# Patient Record
Sex: Male | Born: 1995
Health system: Southern US, Community
[De-identification: ages and names within clinical notes are randomized; demographics above are authoritative.]

## PROBLEM LIST (undated history)

## (undated) DIAGNOSIS — K4090 Unilateral inguinal hernia, without obstruction or gangrene, not specified as recurrent: Secondary | ICD-10-CM

## (undated) DIAGNOSIS — I10 Essential (primary) hypertension: Secondary | ICD-10-CM

## (undated) DIAGNOSIS — T7840XA Allergy, unspecified, initial encounter: Secondary | ICD-10-CM

## (undated) DIAGNOSIS — F909 Attention-deficit hyperactivity disorder, unspecified type: Secondary | ICD-10-CM

## (undated) HISTORY — DX: Attention-deficit hyperactivity disorder, unspecified type: F90.9

## (undated) HISTORY — PX: ADENOIDECTOMY: SUR15

## (undated) HISTORY — PX: BUNIONECTOMY: SHX129

## (undated) HISTORY — DX: Essential (primary) hypertension: I10

## (undated) HISTORY — PX: TONSILLECTOMY: SUR1361

## (undated) HISTORY — DX: Allergy, unspecified, initial encounter: T78.40XA

---
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
## 2013-06-11 ENCOUNTER — Encounter (HOSPITAL_BASED_OUTPATIENT_CLINIC_OR_DEPARTMENT_OTHER): Payer: Self-pay

## 2013-06-11 ENCOUNTER — Ambulatory Visit (HOSPITAL_BASED_OUTPATIENT_CLINIC_OR_DEPARTMENT_OTHER): Admit: 2013-06-11 | Payer: 59 | Admitting: Otolaryngology

## 2013-06-11 SURGERY — SEPTOPLASTY, NOSE, WITH NASAL TURBINATE REDUCTION
Anesthesia: General

## 2013-07-09 ENCOUNTER — Other Ambulatory Visit (HOSPITAL_COMMUNITY): Payer: Self-pay | Admitting: Orthopedic Surgery

## 2013-07-13 ENCOUNTER — Other Ambulatory Visit (HOSPITAL_COMMUNITY): Payer: 59

## 2013-08-13 ENCOUNTER — Ambulatory Visit (INDEPENDENT_AMBULATORY_CARE_PROVIDER_SITE_OTHER): Payer: 59

## 2013-08-13 DIAGNOSIS — Z23 Encounter for immunization: Secondary | ICD-10-CM

## 2013-08-20 ENCOUNTER — Ambulatory Visit (INDEPENDENT_AMBULATORY_CARE_PROVIDER_SITE_OTHER): Payer: 59

## 2013-08-20 VITALS — BP 145/76 | HR 63 | Resp 16 | Ht 77.0 in | Wt 220.0 lb

## 2013-08-20 DIAGNOSIS — M2012 Hallux valgus (acquired), left foot: Secondary | ICD-10-CM

## 2013-08-20 DIAGNOSIS — Z9889 Other specified postprocedural states: Secondary | ICD-10-CM

## 2013-08-20 DIAGNOSIS — M2010 Hallux valgus (acquired), unspecified foot: Secondary | ICD-10-CM

## 2013-08-20 NOTE — Addendum Note (Signed)
Addended by: Alphia Kava D on: 08/20/2013 02:45 PM   Modules accepted: Orders

## 2013-08-20 NOTE — Patient Instructions (Signed)
Scar Minimization You will have a scar anytime you have surgery and a cut is made in the skin or you have something removed from your skin (mole, skin cancer, cyst). Although scars are unavoidable following surgery, there are ways to minimize their appearance. It is important to follow all the instructions you receive from your caregiver about wound care. How your wound heals will influence the appearance of your scar. If you do not follow the wound care instructions as directed, complications such as infection may occur. Wound instructions include keeping the wound clean, moist, and not letting the wound form a scab. Some people form scars that are raised and lumpy (hypertrophic) or larger than the initial wound (keloidal). HOME CARE INSTRUCTIONS   Follow wound care instructions as directed.  Keep the wound clean by washing it with soap and water.  Keep the wound moist with provided antibiotic cream or petroleum jelly until completely healed. Moisten twice a day for about 2 weeks.  Get stitches (sutures) taken out at the scheduled time.  Avoid touching or manipulating your wound unless needed. Wash your hands thoroughly before and after touching your wound.  Follow all restrictions such as limits on exercise or work. This depends on where your scar is located.  Keep the scar protected from sunburn. Cover the scar with sunscreen/sunblock with SPF 30 or higher.  Gently massage the scar using a circular motion to help minimize the appearance of the scar. Do this only after the wound has closed and all the sutures have been removed.  For hypertrophic or keloidal scars, there are several ways to treat and minimize their appearance. Methods include compression therapy, intralesional corticosteroids, laser therapy, or surgery. These methods are performed by your caregiver. Remember that the scar may appear lighter or darker than your normal skin color. This difference in color should even out with  time. SEEK MEDICAL CARE IF:   You have a fever.  You develop signs of infection such as pain, redness, pus, and warmth.  You have questions or concerns. Document Released: 04/10/2010 Document Revised: 01/13/2012 Document Reviewed: 04/10/2010 [facility] Patient Information 2014 [place], [place]. Conditions for long-term topical applications are cocoa butter apply daily to the incision site. Medrma is a topical over-the-counter preparation packet help reduce scar thickening and discoloration

## 2013-08-20 NOTE — Progress Notes (Signed)
  Subjective:    Patient ID: Gary Leon, male    DOB: November 11, 1995, 17 y.o.   MRN: [id number] "It's good."  HPI patient is nearly 5 months postop [place] bunionectomy with screw fixation left foot. No complaints of pain or discomfort. Clinically incision clean dry well coapted slight hypertrophy and discoloration of the scars noted. Range of motion great toe is excellent proximally 80 dorsiflexion 10-15 plantar flex.    Review of Systems not reviewed     Objective:   Physical Exam And VSI. Pedal pulses palpable. No other new findings. X-rays reviewed reveal rectus hallux sesamoid position 1 intact screw fixation first metatarsal with good clinical alignment of the osteotomy and hallux. Clinically and radiographically mild posterior changes noted on the foot. The recommendation for good supportive shoes, or additional orthotics or arch supports to the shoes and cleats. Patient has returned all his regular athletic activities. However his injured his right arm and is likely to have arm surgery sometime in the near future    Assessment & Plan:  Good postop progress with clinical and radiographic left foot status post bunion correction. Maintain good supportive shoes at all times. Continued active passive range of motion exercises. Also suggested he use of cocoa butter or middermal to help with scar. Patient discharged from our care at this time  Alvan Dame DPM

## 2014-08-08 ENCOUNTER — Encounter: Payer: Self-pay | Admitting: Family Medicine

## 2014-08-08 ENCOUNTER — Ambulatory Visit (INDEPENDENT_AMBULATORY_CARE_PROVIDER_SITE_OTHER): Payer: 59 | Admitting: Family Medicine

## 2014-08-08 VITALS — BP 127/74 | HR 81 | Temp 98.0°F | Ht 78.0 in | Wt 220.0 lb

## 2014-08-08 DIAGNOSIS — J0100 Acute maxillary sinusitis, unspecified: Secondary | ICD-10-CM

## 2014-08-08 MED ORDER — AZITHROMYCIN 250 MG PO TABS: ORAL_TABLET | ORAL | Status: DC

## 2014-08-08 NOTE — Progress Notes (Signed)
Pre visit review using our clinic review tool, if applicable. No additional management support is needed unless otherwise documented below in the visit note. 

## 2014-08-08 NOTE — Progress Notes (Signed)
   Subjective:    Patient ID: Gary SlickerBrant R Leon, male    DOB: 1996/04/28, 18 y.o.   MRN: [id number]  HPI Here for 10 days of sinus pressure , PND, and coughing up green sputum. No fever. On Dayquil. He is currently playing baseball for [employer] and he hopes to transfer to either [employer] or [employer] next January. He is off all ADHD meds.    Review of Systems  Constitutional: Negative.   HENT: Positive for congestion, postnasal drip and sinus pressure.   Eyes: Negative.   Respiratory: Positive for cough.        Objective:   Physical Exam  Constitutional: He appears well-developed and well-nourished.  HENT:  Right Ear: External ear normal.  Left Ear: External ear normal.  Nose: Nose normal.  Mouth/Throat: Oropharynx is clear and moist.  Eyes: Conjunctivae are normal.  Pulmonary/Chest: Effort normal and breath sounds normal.  Lymphadenopathy:    He has no cervical adenopathy.          Assessment & Plan:  Add Mucinex

## 2015-03-16 DIAGNOSIS — Z792 Long term (current) use of antibiotics: Secondary | ICD-10-CM | POA: Diagnosis not present

## 2015-03-16 DIAGNOSIS — K4090 Unilateral inguinal hernia, without obstruction or gangrene, not specified as recurrent: Secondary | ICD-10-CM | POA: Insufficient documentation

## 2015-03-16 DIAGNOSIS — I10 Essential (primary) hypertension: Secondary | ICD-10-CM | POA: Diagnosis not present

## 2015-03-16 DIAGNOSIS — Z720 Tobacco use: Secondary | ICD-10-CM | POA: Diagnosis not present

## 2015-03-16 DIAGNOSIS — R1031 Right lower quadrant pain: Secondary | ICD-10-CM | POA: Diagnosis present

## 2015-03-16 NOTE — ED Notes (Signed)
Pt in with co rlq pain since today, has hx of inguinal hernia supposed to have surgical repair.

## 2015-03-17 ENCOUNTER — Emergency Department: Payer: 59

## 2015-03-17 ENCOUNTER — Emergency Department
Admission: EM | Admit: 2015-03-17 | Discharge: 2015-03-17 | Disposition: A | Discharge status: Home / Self Care | Payer: 59 | Attending: Emergency Medicine | Admitting: Emergency Medicine

## 2015-03-17 ENCOUNTER — Encounter: Payer: Self-pay | Admitting: Emergency Medicine

## 2015-03-17 ENCOUNTER — Encounter: Payer: Self-pay | Admitting: Family Medicine

## 2015-03-17 DIAGNOSIS — K4090 Unilateral inguinal hernia, without obstruction or gangrene, not specified as recurrent: Secondary | ICD-10-CM

## 2015-03-17 DIAGNOSIS — R109 Unspecified abdominal pain: Secondary | ICD-10-CM

## 2015-03-17 HISTORY — DX: Unilateral inguinal hernia, without obstruction or gangrene, not specified as recurrent: K40.90

## 2015-03-17 LAB — CBC WITH DIFFERENTIAL/PLATELET
BASOS ABS: 0.1 10*3/uL (ref 0–0.1)
Basophils Relative: 1 %
EOS ABS: 0.2 10*3/uL (ref 0–0.7)
Eosinophils Relative: 2 %
HCT: 41.7 % (ref 40.0–52.0)
Hemoglobin: 14 g/dL (ref 13.0–18.0)
LYMPHS ABS: 4.2 10*3/uL — AB (ref 1.0–3.6)
LYMPHS PCT: 50 %
MCH: 29.5 pg (ref 26.0–34.0)
MCHC: 33.6 g/dL (ref 32.0–36.0)
MCV: 87.7 fL (ref 80.0–100.0)
Monocytes Absolute: 0.7 10*3/uL (ref 0.2–1.0)
Monocytes Relative: 8 %
NEUTROS PCT: 39 %
Neutro Abs: 3.3 10*3/uL (ref 1.4–6.5)
PLATELETS: 223 10*3/uL (ref 150–440)
RBC: 4.75 MIL/uL (ref 4.40–5.90)
RDW: 13 % (ref 11.5–14.5)
WBC: 8.3 10*3/uL (ref 3.8–10.6)

## 2015-03-17 LAB — URINALYSIS COMPLETE WITH MICROSCOPIC (ARMC ONLY)
Bacteria, UA: NONE SEEN
Bilirubin Urine: NEGATIVE
GLUCOSE, UA: NEGATIVE mg/dL
HGB URINE DIPSTICK: NEGATIVE
KETONES UR: NEGATIVE mg/dL
Leukocytes, UA: NEGATIVE
Nitrite: NEGATIVE
Protein, ur: NEGATIVE mg/dL
Specific Gravity, Urine: 1.02 (ref 1.005–1.030)
WBC, UA: NONE SEEN WBC/hpf (ref 0–5)
pH: 7 (ref 5.0–8.0)

## 2015-03-17 LAB — BASIC METABOLIC PANEL
Anion gap: 8 (ref 5–15)
BUN: 15 mg/dL (ref 6–20)
CO2: 25 mmol/L (ref 22–32)
CREATININE: 1.04 mg/dL (ref 0.61–1.24)
Calcium: 9.3 mg/dL (ref 8.9–10.3)
Chloride: 106 mmol/L (ref 101–111)
GFR calc Af Amer: >60 mL/min (ref >60)
GFR calc non Af Amer: >60 mL/min (ref >60)
Glucose, Bld: 102 mg/dL — ABNORMAL HIGH (ref 65–99)
POTASSIUM: 3.6 mmol/L (ref 3.5–5.1)
SODIUM: 139 mmol/L (ref 135–145)

## 2015-03-17 LAB — LACTIC ACID, PLASMA: Lactic Acid, Venous: 1 mmol/L (ref 0.5–2.0)

## 2015-03-17 MED ORDER — ONDANSETRON HCL 4 MG/2ML IJ SOLN
INTRAMUSCULAR | Status: AC
  Administered 2015-03-17: 4 mg via INTRAVENOUS
  Filled 2015-03-17: qty 2

## 2015-03-17 MED ORDER — IOHEXOL 350 MG/ML SOLN
100.0000 mL | Freq: Once | INTRAVENOUS | Status: AC | PRN
  Administered 2015-03-17: 100 mL via INTRAVENOUS

## 2015-03-17 MED ORDER — MORPHINE SULFATE 4 MG/ML IJ SOLN
INTRAMUSCULAR | Status: AC
  Filled 2015-03-17: qty 1

## 2015-03-17 MED ORDER — ONDANSETRON 4 MG PO TBDP
ORAL_TABLET | ORAL | Status: AC
  Administered 2015-03-17: 4 mg via ORAL
  Filled 2015-03-17: qty 1

## 2015-03-17 MED ORDER — OXYCODONE-ACETAMINOPHEN 5-325 MG PO TABS: 1.0000 | ORAL_TABLET | ORAL | Status: DC | PRN

## 2015-03-17 MED ORDER — OXYCODONE-ACETAMINOPHEN 5-325 MG PO TABS
1.0000 | ORAL_TABLET | Freq: Once | ORAL | Status: AC
Start: 2015-03-17 — End: 2015-03-17
  Administered 2015-03-17: 1 via ORAL

## 2015-03-17 MED ORDER — ONDANSETRON HCL 4 MG PO TABS: 4.0000 mg | ORAL_TABLET | Freq: Three times a day (TID) | ORAL | Status: DC | PRN

## 2015-03-17 MED ORDER — ONDANSETRON HCL 4 MG/2ML IJ SOLN
4.0000 mg | Freq: Once | INTRAMUSCULAR | Status: AC
  Administered 2015-03-17: 4 mg via INTRAVENOUS

## 2015-03-17 MED ORDER — IOHEXOL 240 MG/ML SOLN
25.0000 mL | Freq: Once | INTRAMUSCULAR | Status: AC | PRN
  Administered 2015-03-17: 25 mL via ORAL

## 2015-03-17 MED ORDER — OXYCODONE-ACETAMINOPHEN 5-325 MG PO TABS
ORAL_TABLET | ORAL | Status: AC
  Administered 2015-03-17: 1 via ORAL
  Filled 2015-03-17: qty 1

## 2015-03-17 MED ORDER — MORPHINE SULFATE 4 MG/ML IJ SOLN: 4.0000 mg | Freq: Once | INTRAMUSCULAR | Status: DC

## 2015-03-17 MED ORDER — ONDANSETRON 4 MG PO TBDP
4.0000 mg | ORAL_TABLET | Freq: Once | ORAL | Status: AC
  Administered 2015-03-17: 4 mg via ORAL

## 2015-03-17 NOTE — Discharge Instructions (Signed)
1. Take medicines as needed for pain and nausea (Percocet/Zofran #20). 2. Elevate affected area whenever possible. 3. Return to the ER for worsening symptoms, persistent vomiting, fever or other concerns.  Abdominal Pain Many things can cause abdominal pain. Usually, abdominal pain is not caused by a disease and will improve without treatment. It can often be observed and treated at home. Your health care provider will do a physical exam and possibly order blood tests and X-rays to help determine the seriousness of your pain. However, in many cases, more time must pass before a clear cause of the pain can be found. Before that point, your health care provider may not know if you need more testing or further treatment. HOME CARE INSTRUCTIONS  Monitor your abdominal pain for any changes. The following actions may help to alleviate any discomfort you are experiencing:  Only take over-the-counter or prescription medicines as directed by your health care provider.  Do not take laxatives unless directed to do so by your health care provider.  Try a clear liquid diet (broth, tea, or water) as directed by your health care provider. Slowly move to a bland diet as tolerated. SEEK MEDICAL CARE IF:  You have unexplained abdominal pain.  You have abdominal pain associated with nausea or diarrhea.  You have pain when you urinate or have a bowel movement.  You experience abdominal pain that wakes you in the night.  You have abdominal pain that is worsened or improved by eating food.  You have abdominal pain that is worsened with eating fatty foods.  You have a fever. SEEK IMMEDIATE MEDICAL CARE IF:   Your pain does not go away within 2 hours.  You keep throwing up (vomiting).  Your pain is felt only in portions of the abdomen, such as the right side or the left lower portion of the abdomen.  You pass bloody or black tarry stools. MAKE SURE YOU:  Understand these instructions.   Will watch  your condition.   Will get help right away if you are not doing well or get worse.  Document Released: 07/31/2005 Document Revised: 10/26/2013 Document Reviewed: 06/30/2013 [facility] Patient Information 2015 [place], [place]. This information is not intended to replace advice given to you by your health care provider. Make sure you discuss any questions you have with your health care provider.  Inguinal Hernia, Adult Muscles help keep everything in the body in its proper place. But if a weak spot in the muscles develops, something can poke through. That is called a hernia. When this happens in the lower part of the belly (abdomen), it is called an inguinal hernia. (It takes its name from a part of the body in this region called the inguinal canal.) A weak spot in the wall of muscles lets some fat or part of the small intestine bulge through. An inguinal hernia can develop at any age. Men get them more often than women. CAUSES  In adults, an inguinal hernia develops over time.  It can be triggered by:  Suddenly straining the muscles of the lower abdomen.  Lifting heavy objects.  Straining to have a bowel movement. Difficult bowel movements (constipation) can lead to this.  Constant coughing. This may be caused by smoking or lung disease.  Being overweight.  Being pregnant.  Working at a job that requires long periods of standing or heavy lifting.  Having had an inguinal hernia before. One type can be an emergency situation. It is called a strangulated inguinal hernia.  It develops if part of the small intestine slips through the weak spot and cannot get back into the abdomen. The blood supply can be cut off. If that happens, part of the intestine may die. This situation requires emergency surgery. SYMPTOMS  Often, a small inguinal hernia has no symptoms. It is found when a healthcare provider does a physical exam. Larger hernias usually have symptoms.   In adults, symptoms may  include:  A lump in the groin. This is easier to see when the person is standing. It might disappear when lying down.  In men, a lump in the scrotum.  Pain or burning in the groin. This occurs especially when lifting, straining or coughing.  A dull ache or feeling of pressure in the groin.  Signs of a strangulated hernia can include:  A bulge in the groin that becomes very painful and tender to the touch.  A bulge that turns red or purple.  Fever, nausea and vomiting.  Inability to have a bowel movement or to pass gas. DIAGNOSIS  To decide if you have an inguinal hernia, a healthcare provider will probably do a physical examination.  This will include asking questions about any symptoms you have noticed.  The healthcare provider might feel the groin area and ask you to cough. If an inguinal hernia is felt, the healthcare provider may try to slide it back into the abdomen.  Usually no other tests are needed. TREATMENT  Treatments can vary. The size of the hernia makes a difference. Options include:  Watchful waiting. This is often suggested if the hernia is small and you have had no symptoms.  No medical procedure will be done unless symptoms develop.  You will need to watch closely for symptoms. If any occur, contact your healthcare provider right away.  Surgery. This is used if the hernia is larger or you have symptoms.  Open surgery. This is usually an outpatient procedure (you will not stay overnight in a hospital). An cut (incision) is made through the skin in the groin. The hernia is put back inside the abdomen. The weak area in the muscles is then repaired by herniorrhaphy or hernioplasty. Herniorrhaphy: in this type of surgery, the weak muscles are sewn back together. Hernioplasty: a patch or mesh is used to close the weak area in the abdominal wall.  Laparoscopy. In this procedure, a surgeon makes small incisions. A thin tube with a tiny video camera (called a  laparoscope) is put into the abdomen. The surgeon repairs the hernia with mesh by looking with the video camera and using two long instruments. HOME CARE INSTRUCTIONS   After surgery to repair an inguinal hernia:  You will need to take pain medicine prescribed by your healthcare provider. Follow all directions carefully.  You will need to take care of the wound from the incision.  Your activity will be restricted for awhile. This will probably include no heavy lifting for several weeks. You also should not do anything too active for a few weeks. When you can return to work will depend on the type of job that you have.  During "watchful waiting" periods, you should:  Maintain a healthy weight.  Eat a diet high in fiber (fruits, vegetables and whole grains).  Drink plenty of fluids to avoid constipation. This means drinking enough water and other liquids to keep your urine clear or pale yellow.  Do not lift heavy objects.  Do not stand for long periods of time.  Quit smoking.  This should keep you from developing a frequent cough. SEEK MEDICAL CARE IF:   A bulge develops in your groin area.  You feel pain, a burning sensation or pressure in the groin. This might be worse if you are lifting or straining.  You develop a fever of more than 100.5 F (38.1 C). SEEK IMMEDIATE MEDICAL CARE IF:   Pain in the groin increases suddenly.  A bulge in the groin gets bigger suddenly and does not go down.  For men, there is sudden pain in the scrotum. Or, the size of the scrotum increases.  A bulge in the groin area becomes red or purple and is painful to touch.  You have nausea or vomiting that does not go away.  You feel your heart beating much faster than normal.  You cannot have a bowel movement or pass gas.  You develop a fever of more than 102.0 F (38.9 C). Document Released: 03/09/2009 Document Revised: 01/13/2012 Document Reviewed: 03/09/2009 [facility] Patient Information  2015 [employer] [place]. This information is not intended to replace advice given to you by your health care provider. Make sure you discuss any questions you have with your health care provider.

## 2015-03-17 NOTE — ED Provider Notes (Signed)
[facility] Emergency Department Provider Note  ____________________________________________  Time seen: Approximately 2:29 AM  I have reviewed the triage vital signs and the nursing notes.   HISTORY  Chief Complaint Abdominal Pain    HPI Gary Leon is a 19 y.o. male who presents with right lower quadrant pain today. Patient was diagnosed with a sports-related right inguinal herniaapproximately 2 weeks ago and set up with a local surgeon at the end of this month. Patient is in town playing baseball and presents with parents complaining of increased pain today especially on standing and walking stairs. Describes 8/10 sharp, stabbing, nonradiating pain to right lower quadrant associated with nausea. Denies fever, chills, vomiting, diarrhea, dysuria.   Past Medical History  Diagnosis Date  . Allergy   . ADHD (attention deficit hyperactivity disorder)   . Hypertension   . Inguinal hernia     There are no active problems to display for this patient.   Past Surgical History  Procedure Laterality Date  . Adenoidectomy    . Tonsillectomy    . Bunionectomy Left     Current Outpatient Rx  Name  Route  Sig  Dispense  Refill  . azithromycin (ZITHROMAX) 250 MG tablet      As directed Patient not taking: Reported on 03/17/2015   6 tablet   0   . ondansetron (ZOFRAN) 4 MG tablet   Oral   Take 1 tablet (4 mg total) by mouth every 8 (eight) hours as needed for nausea or vomiting.   20 tablet   1   . oxyCODONE-acetaminophen (PERCOCET/ROXICET) 5-325 MG per tablet   Oral   Take 1 tablet by mouth every 4 (four) hours as needed for severe pain.   20 tablet   0     Allergies Review of patient's allergies indicates no known allergies.  History reviewed. No pertinent family history.  Social History History  Substance Use Topics  . Smoking status: Current Some Day Smoker  . Smokeless tobacco: Never Used  . Alcohol Use: Yes     Comment: once a  week    Review of Systems Constitutional: No fever/chills Eyes: No visual changes. ENT: No sore throat. Cardiovascular: Denies chest pain. Respiratory: Denies shortness of breath. Gastrointestinal: Positive for abdominal pain.  Positive for nausea. No vomiting.  No diarrhea.  No constipation. Genitourinary: Negative for dysuria. Musculoskeletal: Negative for back pain. Skin: Negative for rash. Neurological: Negative for headaches, focal weakness or numbness.  10-point ROS otherwise negative.  ____________________________________________   PHYSICAL EXAM:  VITAL SIGNS: ED Triage Vitals  Enc Vitals Group     BP 03/16/15 2227 147/82 mmHg     Pulse Rate 03/16/15 2227 90     Resp 03/16/15 2227 18     Temp 03/16/15 2227 98.2 F (36.8 C)     Temp Source 03/16/15 2227 Oral     SpO2 03/16/15 2227 100 %     Weight 03/16/15 2227 225 lb (102.059 kg)     Height 03/16/15 2227 6\' 5"  (1.956 m)     Head Cir --      Peak Flow --      Pain Score 03/16/15 2228 4     Pain Loc --      Pain Edu? --      Excl. in GC? --     Constitutional: Alert and oriented. Well appearing and in no acute distress. Eyes: Conjunctivae are normal. PERRL. EOMI. Head: Atraumatic. Nose: No congestion/rhinnorhea. Mouth/Throat: Mucous membranes are  moist.  Oropharynx non-erythematous. Neck: No stridor.   Cardiovascular: Normal rate, regular rhythm. Grossly normal heart sounds.  Good peripheral circulation. Respiratory: Normal respiratory effort.  No retractions. Lungs CTAB. Gastrointestinal: Soft, tender to palpation right lower quadrant without rebound/guarding. No distention. No abdominal bruits. No CVA tenderness. Genitourinary: Small right inguinal hernia noted; appears larger on standing. Too small to appreciate whether hernia is reducible. Musculoskeletal: No lower extremity tenderness nor edema.  No joint effusions. Neurologic:  Normal speech and language. No gross focal neurologic deficits are  appreciated. Speech is normal. No gait instability. Skin:  Skin is warm, dry and intact. No rash noted. Psychiatric: Mood and affect are normal. Speech and behavior are normal.  ____________________________________________   LABS (all labs ordered are listed, but only abnormal results are displayed)  Labs Reviewed  BASIC METABOLIC PANEL - Abnormal; Notable for the following:    Glucose, Bld 102 (*)    All other components within normal limits  CBC WITH DIFFERENTIAL/PLATELET - Abnormal; Notable for the following:    Lymphs Abs 4.2 (*)    All other components within normal limits  URINALYSIS COMPLETEWITH MICROSCOPIC ([facility])  - Abnormal; Notable for the following:    Color, Urine YELLOW (*)    APPearance CLEAR (*)    Squamous Epithelial / LPF 0-5 (*)    All other components within normal limits  LACTIC ACID, PLASMA   ____________________________________________  EKG  None ____________________________________________  RADIOLOGY  CT abdomen and pelvis with contrast interpreted per Dr. Andria MeuseStevens:  Small right inguinal hernia containing fluid. No apparent bowel content. No evidence of bowel obstruction.  ____________________________________________   PROCEDURES  Procedure(s) performed: None  Critical Care performed: No  ____________________________________________   INITIAL IMPRESSION / ASSESSMENT AND PLAN / ED COURSE  Pertinent labs & imaging results that were available during my care of the patient were reviewed by me and considered in my medical decision making (see chart for details).  19 year old male presents with increased pain from small right inguinal hernia. Concern for entrapment. Will check lactate and obtain CT abdomen/pelvis to evaluate incarcerated or strangulated hernia. Patient offered morphine but declined.  ----------------------------------------- 5:23 AM on 03/17/2015 -----------------------------------------  Patient resting in no acute  distress. Discussed with patient and parents at length CT results. Given there is no evidence of incarceration or strangulation, patient will be discharged home with analgesics [facility] or follow-up locally. Advised no sports activities until seen by surgery. Strict return precautions given. All verbalized understanding and agrees with plan. ____________________________________________   FINAL CLINICAL IMPRESSION(S) / ED DIAGNOSES  Final diagnoses:  Abdominal pain in male  Right inguinal hernia      Irean HongJade J Sung, MD 03/17/15 [phone]

## 2015-03-22 NOTE — Telephone Encounter (Signed)
I cannot refer him for a problem that I have not evaluated. Did the ER doctor do a referral ?

## 2015-10-17 ENCOUNTER — Ambulatory Visit: Payer: 59 | Admitting: Family Medicine

## 2015-10-24 ENCOUNTER — Encounter: Payer: Self-pay | Admitting: Family Medicine

## 2015-10-24 ENCOUNTER — Ambulatory Visit (INDEPENDENT_AMBULATORY_CARE_PROVIDER_SITE_OTHER): Payer: 59 | Admitting: Family Medicine

## 2015-10-24 VITALS — BP 120/70 | Temp 97.6°F | Wt 220.8 lb

## 2015-10-24 DIAGNOSIS — J209 Acute bronchitis, unspecified: Secondary | ICD-10-CM | POA: Diagnosis not present

## 2015-10-24 MED ORDER — AZITHROMYCIN 250 MG PO TABS: ORAL_TABLET | ORAL | Status: DC

## 2015-10-24 MED ORDER — HYDROCODONE-HOMATROPINE 5-1.5 MG/5ML PO SYRP: 5.0000 mL | ORAL_SOLUTION | ORAL | Status: DC | PRN

## 2015-10-24 NOTE — Progress Notes (Signed)
   Subjective:    Patient ID: Gary Leon, male    DOB: 1996-05-23, 19 y.o.   MRN: [id number]  HPI Here for 2 weeks of stuffy head, PND, chest congestion and coughing up yellow sputum. No fever.    Review of Systems  Constitutional: Negative.   HENT: Positive for congestion, postnasal drip, sinus pressure and sore throat. Negative for ear pain.   Eyes: Negative.   Respiratory: Positive for chest tightness. Negative for cough.   Cardiovascular: Negative for chest pain.       Objective:   Physical Exam  Constitutional: He appears well-developed and well-nourished.  HENT:  Right Ear: External ear normal.  Left Ear: External ear normal.  Nose: Nose normal.  Mouth/Throat: Oropharynx is clear and moist.  Eyes: Conjunctivae are normal.  Neck: No thyromegaly present.  Pulmonary/Chest: Effort normal. No respiratory distress. He has no wheezes. He has no rales.  Scattered rhonchi   Lymphadenopathy:    He has no cervical adenopathy.          Assessment & Plan:  Bronchitis, treat with a Zpack and Mucinex

## 2015-10-24 NOTE — Progress Notes (Signed)
Pre visit review using our clinic review tool, if applicable. No additional management support is needed unless otherwise documented below in the visit note. 

## 2016-03-15 DIAGNOSIS — Z5189 Encounter for other specified aftercare: Secondary | ICD-10-CM | POA: Diagnosis not present

## 2016-03-15 DIAGNOSIS — M65811 Other synovitis and tenosynovitis, right shoulder: Secondary | ICD-10-CM | POA: Diagnosis not present

## 2016-03-15 DIAGNOSIS — S43421A Sprain of right rotator cuff capsule, initial encounter: Secondary | ICD-10-CM | POA: Diagnosis not present

## 2016-03-15 DIAGNOSIS — I872 Venous insufficiency (chronic) (peripheral): Secondary | ICD-10-CM | POA: Diagnosis not present

## 2016-03-15 DIAGNOSIS — S46011A Strain of muscle(s) and tendon(s) of the rotator cuff of right shoulder, initial encounter: Secondary | ICD-10-CM | POA: Diagnosis not present

## 2016-03-15 DIAGNOSIS — S46811A Strain of other muscles, fascia and tendons at shoulder and upper arm level, right arm, initial encounter: Secondary | ICD-10-CM | POA: Diagnosis not present

## 2016-03-15 DIAGNOSIS — Z87891 Personal history of nicotine dependence: Secondary | ICD-10-CM | POA: Diagnosis not present

## 2016-03-15 DIAGNOSIS — S43491A Other sprain of right shoulder joint, initial encounter: Secondary | ICD-10-CM | POA: Diagnosis not present

## 2016-03-15 DIAGNOSIS — S43431A Superior glenoid labrum lesion of right shoulder, initial encounter: Secondary | ICD-10-CM | POA: Diagnosis not present

## 2016-03-15 DIAGNOSIS — M25511 Pain in right shoulder: Secondary | ICD-10-CM | POA: Diagnosis not present

## 2016-03-15 NOTE — Op Note (Signed)
Operative Report    Duane Lucas  Duane Lucas [name], MD  Service Date: 03/15/2016    PREOPERATIVE DIAGNOSES:  1.  Glenoid labral tear, right shoulder.  2.  Partial rotator cuff tear.    PROCEDURE PERFORMED:  1.  Arthroscopy of the right shoulder.  2.  Repair of glenoid labral tear, [id number].    POSTOPERATIVE DIAGNOSES:  1.  Glenoid labral tear, right shoulder.  2.  Partial rotator cuff tear.    SURGEON:  Dr. Cheree DittoGraham.    ANESTHESIA:  General.    INDICATIONS FOR PROCEDURE:  According to admission H    ASSISTANT:  [name], PA.  The assistant helped with  prepping, draping and positioning of the patient.  The assistant was  present throughout the procedure, assisting with instrument transfer,  traction/manipulation, and maintaining optimal visualization during  the surgery.  She also assisted in wound closure, application of  dressing and/or splints, and with patient transfer.  All of this  improved efficiency, increased the safety for the patient and  operating room staff, and facilitated the desired result while  minimizing operative time.    OPERATIVE FINDINGS:  Included complex glenoid labral tearing involving  the biceps anchor and partial thickness tearing of the posterior  aspect of the supraspinatus.  The patient underwent arthroscopy with  debridement followed by glenoid labral repair using Parcus Draw Tight  anchors.       OPERATIVE PROCEDURE IN DETAIL:  The patient was taken to the operating  room and placed on the table in supine position and following  placement of an interscalene block, general anesthesia was induced.   The patient was placed in the beachchair position using the McConnell  chair and holder and left shoulder was prepped and draped in the usual  sterile fashion.  One gram of IV Ancef was infused prior to surgical  incision.      Inspection was begun in the glenohumeral joint and there was noted to  be a moderate degree of synovitis.  Collene MaresShaver was used to perform  debridement anterior,  posterior and inferior recess.  A flap tear was  noted on the superior anterior segment of the labrum and this was  debrided back to a stable margin.  Additional flap was noted posterior  inferior and this was debrided.  Instability was noted of the labrum  involving the biceps anchor.  It was elected to proceed with repair.   A second anterior cannula was established and repair commenced.  Three  1.8 Parcus Draw Tight anchors were placed, 1 posterior to the biceps  and 2 anterior with repair completed.  Excellent stability was  achieved with care taken to not over-constrain the biceps.   Debridement of the partial thickness rotator cuff tear was completed  back to a stable margin.  Articular surfaces were largely intact with  some chondral fraying of the posterior aspect.  Inspection of the  bursal surface demonstrated normal bursal surfaces, normal rotator  cuff.  Bursal surface.     At this point, wounds were thoroughly irrigated and drained.  Closure  was performed using interrupted 3-0 Vicryl in the subcutaneous layer.   A sterile compressive dressing was applied.     The patient was taken to the recovery room in stable condition.  The  patient will be discharged to home, sling left arm, ice elevation.   Dressing change in two days. Follow up in the office in seven to ten  days for a recheck.  SPECIMEN:  None.    BLOOD LOSS:  None.      [name], MD  TR: *n DD: 03/15/2016 14:29 TD: 03/15/2016 14:49 Job#: [id number]  \\[id number]\\DOC#: [id number]  \\[id number]\\  Signature Line    Electronically Signed on 03/16/2016 02:11 PM EDT  ________________________________________________  [name]

## 2016-03-15 NOTE — Discharge Summary (Signed)
 Inpatient Patient Summary               [facility] Ambulatory Surgery & Pain Management - [facility]  [address]  [address]  [place], [place] [address]  [phone]  Patient Discharge Instructions  Name: Duane Lucas, Duane Lucas  Current Date: 03/15/2016 10:00:47  DOB: 03/31/1996 MRN: [id number] FIN: NBR%>[phone]  Patient Address: [address] [place] [address]  Patient Phone: [phone]  Primary Care Provider:  Name: GRIFFITH NORLEEN HERO  Phone: [phone]   Immunizations Provided:    Discharge Diagnosis:   Discharged To: ANTICIPATED%>  Home Treatments: ANTICIPATED%>  Devices/Equipment: REHAB%>  Post Hospital Services: HOSPITAL SERVICES%>  Professional Skilled Services: SKILLED SERVICES%>  Therapist, sports and Community Resources: SERV AND COMM RES, ANTICIPATED%>  Mode of Discharge Transportation: TRANSPORTATION%>  Discharge Orders         Discharge Patient 03/15/16 9:55:00 EDT, Discharge Home/Self Care      Comment:   Medications   During the course of your visit, your medication list was updated with the most current information. The details of those changes are reflected below:         New Medications  Printed Prescriptions  acetaminophen-oxyCODONE (Percocet 10/325 oral tablet) 1 Tabs Oral (given by mouth) every 4 hours as needed for severe pain for 14 Days. Refills: 0., MAX DAILY DOSE OF ACETAMINOPHEN = 3000 MG  Last Dose:____________________  ondansetron (ondansetron 4 mg oral tablet) 1 Tabs Oral (given by mouth) every 6 hours for 14 Days. Refills: 0.  Last Dose:____________________  Medications that have not changed  Other Medications  diphenhydrAMINE (Benadryl 25 mg oral capsule) as needed for sleep.  Last Dose:____________________  ibuprofen 200 Milligram Oral (given by mouth) every 6 hours as needed For Pain.  Last Dose:____________________      [facility] would like to thank you for allowing us  to assist you with your healthcare needs. The following includes patient  education materials and information regarding your injury/illness.  Etzler, Duane Lucas has been given the following list of follow-up instructions, prescriptions, and patient education materials:  Follow-up Instructions             With: Address: When:   NORLEEN GRIFFITH [address], [address]  [place], [place]  [address]  [phone] Business (1)        With: Address: When:   KREG BURNARD NOVAK [address]  [place], [place]  [address]  [phone]    Comments:   Call for followup appointment in 7-10 days                It is important to always keep an active list of medications available so that you can share with other providers and manage your medications appropriately. As an additional courtesy, we are also providing you with your final active medications list that you can keep with you.          acetaminophen-oxyCODONE (Percocet 10/325 oral tablet) 1 Tabs Oral (given by mouth) every 4 hours as needed for severe pain for 14 Days. Refills: 0., MAX DAILY DOSE OF ACETAMINOPHEN = 3000 MG  diphenhydrAMINE (Benadryl 25 mg oral capsule) as needed for sleep.  ibuprofen 200 Milligram Oral (given by mouth) every 6 hours as needed For Pain.  ondansetron (ondansetron 4 mg oral tablet) 1 Tabs Oral (given by mouth) every 6 hours for 14 Days. Refills: 0.      Take only  the medications listed above. Contact your doctor prior to taking any medications not on this list.  Discharge instructions, if any, will display below  Instructions for Diet: INSTRUCTIONS FOR DIET%>   Instructions for Supplements: SUPPLEMENT INSTRUCTIONS%>   Instructions for Activity: INSTRUCTIONS FOR ACTIVITY%>   Instructions for Wound Care: INSTRUCTIONS FOR WOUND CARE%>  Medication leaflets, if any, will display below     Patient education materials, if any, will display below           Out Patient Post Operative Instructions  General Information:  - You may experience lightheadedness, forgetfulness, dizziness, sleepiness,  headache, nausea, sore throat, or muscular pains following surgery  - For any emergencies call 911  -Your reflexes will be dimished after receiving anesthetic drugs         Do not operate a vechicle or heavy machinery for 24 hours         Do not drink any alcoholic beverages or smoke for 24 hours         Avoid making any important decisions for 24 hours         Do not stay alone for the next 24 hours  Diet/Fluids  -Begin with clear liquids, then progress to your regular diet if no nausea   -Greasy and spicy foods are not advised  Activity  -You are advised to go directly home from the hospital and restrict your activites for the rest of the day  Medications:  -Follow your Discharge Medication sheet, as instructed  -If ordered, begin any newly prescribed medications. Discontinue use if: nausea, vomiting, itching or rash develops and call your doctor   -If you develop a fever (over 101*), chills, active bleeding, excessive swelling, or nausea or vomiting past the 24hr period call your doctor.   Dressing:   Keep clean and dry:   Change dressing:   Additional Instructions:   Follow up Care:  Call the office if you don't already have an appt scheduled.                             LABRAL REPAIR  DISCHARGE INSTRUCTIONS    Resume a Regular Diet    Sling at all times, except at showertime! May remove sling in safe enviroment, to gently exercise elbow only.    Change dressing in 48 hours. May shower. Leave steri-strips on.    Ice operative shoulder. 30 mins off and on while awake for 72 hours     Begin 81mg  aspirin every day for 2 weeks.    Return to the office in 7-10 days. Please call Landry Callander at [phone] to schedule with Burnard Isaacs, PA-C or Dr. Arlyss.            IS IT A STROKE? Act FAST and Check for these signs:   FACE Does the face look uneven?   ARM Does one arm drift down?   SPEECH Does their speech sound strange?   TIME Call 9-1-1 at any sign of stroke  Heart Attack Signs  Chest discomfort: Most heart attacks  involve discomfort in the center of the chest and lasts more than a few minutes, or goes away and comes back. It can feel like uncomfortable pressure, squeezing, fullness or pain.  Discomfort in upper body: Symptoms can include pain or discomfort in one or both arms, back, neck, jaw or stomach.  Shortness of breath: With or without discomfort.  Other signs: Breaking out in a  cold sweat, nausea, or lightheaded.  Remember, MINUTES DO MATTER. If you experience any of these heart attack warning signs, call 9-1-1 to get immediate medical attention!       Yes - Patient/Family/Caregiver demonstrates understanding of instructions given  ______________________________ ___________ ___________________ ___________  Patient/Family/ Caregiver Signature Date/Time Provider Signature Date/Time

## 2016-03-15 NOTE — Procedures (Signed)
 IntraOp Record - [facility]             IntraOp Record - [facility] Summary                                                                   Primary Physician:        GRIFFITH NORLEEN HERO    Case Number:              [id number]    Finalized Date/Time:      03/15/16 17:34:48    Pt. Name:                 Duane Lucas, Duane Lucas    D.O.B./Sex:               [birth year]    Male    Med Rec #:                [id number]    Physician:                GRIFFITH NORLEEN HERO    Financial #:              [id number]    Pt. Type:                 S    Room/Bed:                 /    Admit/Disch:              03/15/16 06:45:00 -    Institution:       [facility] - Case Times                                                                                                         Entry 1                                                                                                          Patient      In Room Time             03/15/16 07:57:00               Out Room Time                   03/15/16 09:46:00    Anesthesia     Procedure  Start Time               03/15/16 08:21:00               Stop Time                       03/15/16 09:39:00    Last Modified By:         Elayne OBIE Avelina JULIANNA                              03/15/16 09:57:06      [facility] - Case Times Audit                                                                          03/15/16 09:57:06         Owner: VALETTA                               Modifier: KOSTPA                                                        <+> 1         Out Room Time        <+> 1         Stop Time        J[facility] - Safety Checklist - Sign In                                                                                         Entry 1                                                                                                          History/Physical on       Yes                             Procedure Consent               Yes    Chart  on Chart     Site Marked (if            Yes    applicable)     Last Modified By:         Elayne, RN, Avelina FALCON                              03/15/16 08:37:12      [facility] - Case Attendance                                                                                                    Entry 1                         Entry 2                         Entry 3                                          Case Attendee             PENDARVIS-MD, JR, ALLEN         GRAHAM-MD,  NORLEEN CHRISTELLA Elayne, RN, Avelina FALCON BRAVO    Role Performed            Anesthesiologist                Surgeon Primary                 Circulator    Time In                   03/15/16 07:57:00               03/15/16 07:57:00               03/15/16 07:57:00    Time Out                  03/15/16 09:46:00               03/15/16 09:46:00               03/15/16 09:46:00    Procedure                 Shoulder Arthroscopy            Shoulder Arthroscopy            Shoulder Arthroscopy                              with Repair(Right)  with Repair(Right)              with Repair(Right)    Last Modified By:         Elayne, RN, Avelina JULIANNA Elayne, RN, Avelina JULIANNA Elayne, RNAvelina JULIANNA                              03/15/16 09:57:07               03/15/16 09:57:07               03/15/16 09:57:07                                Entry 4                         Entry 5                                                                          Case Attendee             SHERIAN TANDA DELENA KREG BURNARD B    Role Performed            Surgical Scrub                  Other Authorized                                                              Personnel    Time In                   03/15/16 07:57:00               03/15/16 08:28:00    Time Out                  03/15/16 09:46:00               03/15/16 09:46:00    Procedure                 Shoulder Arthroscopy            Shoulder Arthroscopy                              with Repair(Right)               with Repair(Right)    Last Modified By:         Elayne, RN, Avelina JULIANNA Elayne, RN, Avelina JULIANNA  03/15/16 09:57:07               03/15/16 11:26:27    General Comments:            [facility]  [facility] - Case Attendance Audit                                                                     03/15/16 11:26:27         Owner: VALETTA                               Modifier: KOSTPA                                                        <+> 5         Case Attendee        <+> 5         Role Performed        <+> 5         Time In        <+> 5         Time Out        <+> 5         Procedure     03/15/16 09:57:07         Owner: [email or web address]                               Modifier: KOSTPA                                                            1     <*> Time Out            1     <*> Procedure            1     <*> Procedure                              Shoulder Arthroscopy with Repair(Right)            1     <*> Procedure                              Shoulder Arthroscopy with Repair(Right)            2     <*> Time Out            2     <*> Procedure            2     <*> Procedure  Shoulder Arthroscopy with Repair(Right)            2     <*> Procedure                              Shoulder Arthroscopy with Repair(Right)            3     <*> Time In            3     <*> Time Out            3     <*> Procedure            3     <*> Procedure                              Shoulder Arthroscopy with Repair(Right)            3     <*> Procedure                              Shoulder Arthroscopy with Repair(Right)            4     <*> Time In            4     <*> Time Out            4     <*> Procedure            4     <*> Procedure                              Shoulder Arthroscopy with Repair(Right)            4     <*> Procedure                              Shoulder Arthroscopy with Repair(Right)     03/15/16 08:25:46         Owner:  XNDUEJ                               Modifier: KOSTPA                                                            3     <*> Case Attendee                          Elayne, RN, Avelina FALCON            3     <*> Case Attendee                          Elayne RN, Avelina FALCON            3     <*> Case Attendee  Elayne, RN, Avelina FALCON            3     <*> Role Performed            3     <*> Role Performed            3     <*> Role Performed            3     <*> Procedure            3     <*> Procedure            4     <*> Case Attendee                          SHINE,  RONALD A            4     <*> Case Attendee                          SHINE,  RONALD A            4     <*> Case Attendee                          SHINE,  RONALD A            4     <*> Role Performed            4     <*> Role Performed            4     <*> Role Performed            4     <*> Procedure            4     <*> Procedure     03/15/16 08:22:44         Owner: XNDUEJ                               Modifier: KOSTPA                                                            1     <*> Time In            1     <*> Time In            1     <*> Time In            1     <*> Procedure            1     <*> Procedure            2     <*> Time In            2     <*> Time In            2     <*> Time In            2     <*> Procedure  2     <*> Procedure        GPNM - Skin Assessment                                                                                                    Entry 1                                                                                                          Skin Integrity            Intact    Last Modified By:         Elayne, RN, Avelina FALCON                              03/15/16 08:37:32      [facility] - Patient Positioning                                                                                                Entry 1                                                                                                           Procedure                 Shoulder Arthroscopy            Body Position                   Sitting                              with Repair(Right)    Left Arm Position         Flexed on Padded Arm  Right Arm Position              Secured in Limb                              Board w/Security Strap                                          Positioner    Left Leg Position         Extended Security               Right Leg Position              Extended Security                              Strap, Pillow Under                                             Strap, Padding Under                              Knee, Pillow Under                                              Knee, Pillow Under                              Lower Leg                                                       Lower Leg    Feet Uncrossed            Yes                             Pressure Points                 Yes                                                              Checked     Positioning Device        Arm Cradle Foam/Gel, C          Positioned By                   GRIFFITH NORLEEN HERO,                              Cradle Foam, [place]  PENDARVIS-MD, JR, ALLEN                              Chair/Shoulder Table,                                           FORBES Beagle, RN, Avelina Richards, Safety Strap                                            F    Outcome Met (O.80)        Yes    Last Modified By:         Beagle, RN, Avelina FALCON                              03/15/16 08:37:07      [facility] - Skin Prep                                                                                                          Entry 1                                                                                                          Hair Removal     Skin Prep      Prep Agents (Im.270)     Chlorhexidine Gluconate         Prep Area (Im.270)              Arm, Hand, Chest, Neck,                               2% w/Alcohol                                                    Shoulder     Prep Area Details        Right  Prep By                         GRIFFITH NORLEEN HERO    Outcome Met (O.100)       Yes    Last Modified By:         Elayne, RN, Avelina FALCON                              03/15/16 08:37:55      [facility] - Counts Initial and Final                                                                                           Entry 1                                                                                                          Initial Counts      Initial Counts           Elayne, RN, Avelina FALCON,         Items included in               Sponges, Sharps     Performed By             SHERIAN INGLE A                the Initial Count     Final Counts      Final Counts             Elayne, RN, Avelina FALCON,         Final Count Status              Correct     Performed By             SHERIAN INGLE A     Items Included in        Sponges, Sharps     Final Count     Outcome Met (O.20)        Yes    Last Modified By:         Elayne, RN, Avelina FALCON                              03/15/16 08:26:34      [facility] - General Case Data  Entry 1                                                                                                          Case Information      ASA Class                1                               Case Level                      Level 3     OR                       JI 01                           Specialty                       Orthopedic (SN)     Wound Class              1-Clean    Preop Diagnosis           S43.431A    Last Modified By:         Elayne, RN, Avelina FALCON                              03/15/16 08:22:34      [facility] - Fire Risk Assessment                                                                                               Entry 1                                                                                                           Fire Risk                 Alcohol Based Prep              Fire Risk  Score                 2    Assessment: If            Solution, Ignition    checked, checkmark        Source In Use    = 1 point     Last Modified By:         Elayne, RN, Avelina FALCON                              03/15/16 08:28:00      [facility] - Safety Checklist - Time Out                                                                                        Entry 1                                                                                                          Surgical/Procedure        Yes                             Time Out Complete               03/15/16 08:20:00    Team confirms     correct patient,     correct site and     correct procedure     Last Modified By:         Elayne OBIE Avelina FALCON                              03/15/16 08:37:29      [facility] - Cautery                                                                                                            Entry 1  ESU Type                  GENERATOR ARTHROWAND            Identification                  [id number]                                                              Number     Bipolar Setting           7                               Grounding Pad                   No    (watts)                                                   Needed?     Outcome Met (O.10)        Yes    Last Modified By:         Elayne, RN, Avelina FALCON                              03/15/16 08:23:45      [facility] - Patient Care Devices                                                                                               Entry 1                                                                                                          Equipment Type            MACHINE SEQUENTIAL              SCD Sleeve Site                 Legs  Bilateral                              COMPRESSION    Equipment/Tag Number  [id number]                          Initiated Pre                   Yes                                                              Induction     Last Modified By:         Elayne RN, Avelina FALCON                              03/15/16 08:34:06      [facility] - Medications                                                                                                        Entry 1                                                                                                          Time Administered         03/15/16 08:21:00               Medication                      EPINEPHRINE HCL                                                                                              INJECTION 11-998 1 ML    Route of Admin            Irrigation                      Dose/Volume                     3 ML                                                              (  include amount and                                                               unit of measure)     Site                      Shoulder                        Site Detail                     Right    Administered By           GRAHAM-MD,  JOHN M              Outcome Met (O.130)             Yes    Last Modified By:         Elayne, RN, Avelina FALCON                              03/15/16 08:35:08      [facility] - Medications Audit                                                                         03/15/16 08:35:08         Owner: VALETTA                               Modifier: KOSTPA                                                        Entry 1 was deleted.  Higher numbered entries shifted one position to fill the gap.        <-> 1         Medication                             EPINEPHRINE HCL INJECTION 11-998 1 ML        <-> 1         Route of Admin                         Irrigation        <-> 1         Administered By                        GRAHAM-MD,  JOHN M        <-> 1          Dose/Volume (include amount and  3 ML                      unit of measure)        <-> 1         Time Administered                      03/15/16 08:21:00        <-> 1         Outcome Met (O.130)        <-> 1         Site                                   Shoulder        <-> 1         Site Detail        [facility] - Implants/Endoscopy Stents                                                                                          Entry 1                                                                                                          Implant/Explant           Implant                         Catalog #                      [id number]    Implant     Identification      Description              ANCHOR DRAW TIGHT 1.8MM         Expiration Date                 10/26/20     Lot Number               [id number]                            Manufacturer                    [employer]    Usage Data      Implant Site             RIGHT SHOULDER                  Quantity  3    Last Modified By:         Elayne, RN, Avelina FALCON                              03/15/16 09:24:27      [facility] - Implants/Endoscopy Stents Audit                                                           03/15/16 09:24:27         Owner: XNDUEJ                               Modifier: KOSTPA                                                            1     <*> Description                            ANCHOR DRAW TIGHT 1.8MM            1     <*> Quantity                               2     03/15/16 09:03:22         Owner: XNDUEJ                               Modifier: KOSTPA                                                            1     <*> Description                            ANCHOR DRAW TIGHT 1.8MM            1     <*> Quantity                               1        [facility] - Communication  Entry 1                                                                                                           Communication             Phone Call                      Communication By                Elayne OBIE Avelina JULIANNA    Date and Time             03/15/16 09:05:00               Communication To                MOM- ANGELA    Last Modified By:         Elayne RNAvelina JULIANNA                              03/15/16 09:09:13      [facility] - Dressing/Packing                                                                                                   Entry 1                                                                                                          Site                      Shoulder                        Site Details                    Right    Dressing Item     Details      Dressing Item            Band-Aid, Wound Closure         Miscellaneous  Shoulder     (Im.290)                 Strip, ABD, 4x4's, Tape         (Im.290)                        Immobilizer/Sling    Last Modified By:         Elayne, RN, Avelina FALCON                              03/15/16 08:27:51      [facility] - Procedures                                                                                                         Entry 1                                                                                                          Procedure     Description      Procedure                Shoulder Arthroscopy            Modifiers                       Right                              with Repair     Surgical Procedure       SHOULDER ARTHROSCOPY W/     Text                     DEBRIDEMENT, POSS                              LABRAL REPAIR RIGHT    Primary Procedure         Yes                             Primary Surgeon                 GRIFFITH RUSH [facility]    Start                     03/15/16 08:21:00  Stop                            03/15/16 09:39:00    Anesthesia Type           General                         Surgical Service                Orthopedic (SN)    Wound Class                1-Clean    Last Modified By:         Elayne RNAvelina FALCON                              03/15/16 09:57:24      [facility] - Procedures Audit                                                                          03/15/16 09:57:24         Owner: VALETTA                               Modifier: KOSTPA                                                        <+> 1         Stop        [facility] - Safety Checklist - Sign Out                                                                                        Entry 1                                                                                                          Patient Safety            Yes    Communication Guide     Used Throughout Case     Last Modified By:  Elayne OBIE Avelina JULIANNA                              03/15/16 08:22:47      Unfinalized History                                                                                     Date/Time            Username    Reason for Unfinalizing         Freetext Reason for Unfinalizing                                          03/15/16 11:25       KOSTPA      Finish Documentation          03/15/16 17:34       [facility]   Charging

## 2016-03-15 NOTE — Discharge Summary (Signed)
 Inpatient Clinical Summary             [facility]  Post-Acute Care Transfer Instructions  PERSON INFORMATION   Name: ARAGON, SCARANTINO   MRN: [id number]    FIN#: [id number]   PHYSICIANS  Admitting Physician: GRIFFITH NORLEEN HERO  Attending Physician: GRIFFITH NORLEEN HERO   PCP: GRIFFITH NORLEEN HERO  Discharge Diagnosis:   Comment:       PATIENT EDUCATION INFORMATION  Instructions:             OUT PATIENT POST OP [place] (CUSTOM); LABRAL/RC REPAIR POST-OP INSTRUCTIONS-KK (KRUSKE) DIANNA)  Medication Leaflets:               Follow-up:                        With: Address: When:   [facility] [address], [address]  [place], [place]  [address]  [phone] Business (1)        With: Address: When:   [facility] [address]  [place], [place]  [address]  [phone]    Comments:   Call for followup appointment in 7-10 days                         MEDICATION LIST  Medication Reconciliation at Discharge:         New Medications  Printed Prescriptions  acetaminophen-oxyCODONE (Percocet 10/325 oral tablet) 1 Tabs Oral (given by mouth) every 4 hours as needed for severe pain for 14 Days. Refills: 0., MAX DAILY DOSE OF ACETAMINOPHEN = 3000 MG  Last Dose:____________________  ondansetron (ondansetron 4 mg oral tablet) 1 Tabs Oral (given by mouth) every 6 hours for 14 Days. Refills: 0.  Last Dose:____________________  Medications that have not changed  Other Medications  diphenhydrAMINE (Benadryl 25 mg oral capsule) as needed for sleep.  Last Dose:____________________  ibuprofen 200 Milligram Oral (given by mouth) every 6 hours as needed For Pain.  Last Dose:____________________         Patient's Final Home Medication List Upon Discharge:          acetaminophen-oxyCODONE (Percocet 10/325 oral tablet) 1 Tabs Oral (given by mouth) every 4 hours as needed for severe pain for 14 Days. Refills: 0., MAX DAILY DOSE OF ACETAMINOPHEN = 3000 MG  diphenhydrAMINE (Benadryl 25 mg oral  capsule) as needed for sleep.  ibuprofen 200 Milligram Oral (given by mouth) every 6 hours as needed For Pain.  ondansetron (ondansetron 4 mg oral tablet) 1 Tabs Oral (given by mouth) every 6 hours for 14 Days. Refills: 0.         Comment:       ORDERS         Order Name Order Details   Discharge Patient 03/15/16 9:55:00 EDT, Discharge Home/Self Care

## 2016-06-10 DIAGNOSIS — H5213 Myopia, bilateral: Secondary | ICD-10-CM | POA: Diagnosis not present

## 2016-07-31 DIAGNOSIS — M7061 Trochanteric bursitis, right hip: Secondary | ICD-10-CM | POA: Diagnosis not present

## 2016-10-17 DIAGNOSIS — J3501 Chronic tonsillitis: Secondary | ICD-10-CM | POA: Diagnosis not present

## 2016-10-21 DIAGNOSIS — R040 Epistaxis: Secondary | ICD-10-CM | POA: Diagnosis not present

## 2016-10-21 DIAGNOSIS — J3501 Chronic tonsillitis: Secondary | ICD-10-CM | POA: Diagnosis not present

## 2016-10-21 MED FILL — HYDROCOD-APAP 7.5-325/15ML: 7.5-325 | 7 days supply | Qty: 420 | Fill #0

## 2016-10-21 MED FILL — AMOXICILLIN 250 MG/5 ML SUS: 250 | 7 days supply | Qty: 150 | Fill #0

## 2016-10-21 MED FILL — MUPIROCIN 2% OINTMENT: 2 | 5 days supply | Qty: 22 | Fill #0

## 2017-04-24 DIAGNOSIS — M7712 Lateral epicondylitis, left elbow: Secondary | ICD-10-CM | POA: Diagnosis not present

## 2017-06-30 DIAGNOSIS — H5213 Myopia, bilateral: Secondary | ICD-10-CM | POA: Diagnosis not present

## 2017-07-24 ENCOUNTER — Encounter: Payer: Self-pay | Admitting: Family Medicine

## 2017-08-15 ENCOUNTER — Ambulatory Visit: Payer: 59 | Admitting: Family Medicine

## 2017-08-22 ENCOUNTER — Ambulatory Visit: Payer: 59 | Admitting: Family Medicine

## 2017-09-23 ENCOUNTER — Ambulatory Visit: Payer: 59 | Admitting: Family Medicine

## 2017-10-24 ENCOUNTER — Ambulatory Visit (INDEPENDENT_AMBULATORY_CARE_PROVIDER_SITE_OTHER): Payer: 59 | Admitting: Family Medicine

## 2017-10-24 ENCOUNTER — Encounter: Payer: Self-pay | Admitting: Family Medicine

## 2017-10-24 VITALS — BP 130/60 | HR 67 | Temp 97.9°F | Wt 255.0 lb

## 2017-10-24 DIAGNOSIS — F900 Attention-deficit hyperactivity disorder, predominantly inattentive type: Secondary | ICD-10-CM

## 2017-10-24 DIAGNOSIS — J018 Other acute sinusitis: Secondary | ICD-10-CM | POA: Diagnosis not present

## 2017-10-24 DIAGNOSIS — F411 Generalized anxiety disorder: Secondary | ICD-10-CM | POA: Diagnosis not present

## 2017-10-24 DIAGNOSIS — R000 Tachycardia, unspecified: Secondary | ICD-10-CM

## 2017-10-24 LAB — CBC WITH DIFFERENTIAL/PLATELET
BASOS ABS: 0 10*3/uL (ref 0.0–0.1)
Basophils Relative: 0.7 % (ref 0.0–3.0)
EOS PCT: 3.1 % (ref 0.0–5.0)
Eosinophils Absolute: 0.2 10*3/uL (ref 0.0–0.7)
HCT: 45.8 % (ref 39.0–52.0)
Hemoglobin: 15.3 g/dL (ref 13.0–17.0)
LYMPHS ABS: 2.3 10*3/uL (ref 0.7–4.0)
Lymphocytes Relative: 33.1 % (ref 12.0–46.0)
MCHC: 33.3 g/dL (ref 30.0–36.0)
MCV: 88.4 fl (ref 78.0–100.0)
MONOS PCT: 9.1 % (ref 3.0–12.0)
Monocytes Absolute: 0.6 10*3/uL (ref 0.1–1.0)
NEUTROS ABS: 3.7 10*3/uL (ref 1.4–7.7)
NEUTROS PCT: 54 % (ref 43.0–77.0)
Platelets: 252 10*3/uL (ref 150.0–400.0)
RBC: 5.18 Mil/uL (ref 4.22–5.81)
RDW: 12.6 % (ref 11.5–15.5)
WBC: 6.8 10*3/uL (ref 4.0–10.5)

## 2017-10-24 LAB — BASIC METABOLIC PANEL
BUN: 13 mg/dL (ref 6–23)
CALCIUM: 9.2 mg/dL (ref 8.4–10.5)
CO2: 27 mEq/L (ref 19–32)
Chloride: 103 mEq/L (ref 96–112)
Creatinine, Ser: 1.1 mg/dL (ref 0.40–1.50)
GFR: 87.71 mL/min (ref >60.00)
Glucose, Bld: 88 mg/dL (ref 70–99)
POTASSIUM: 3.9 meq/L (ref 3.5–5.1)
Sodium: 138 mEq/L (ref 135–145)

## 2017-10-24 LAB — HEPATIC FUNCTION PANEL
ALK PHOS: 52 U/L (ref 39–117)
ALT: 23 U/L (ref 0–53)
AST: 19 U/L (ref 0–37)
Albumin: 4.6 g/dL (ref 3.5–5.2)
BILIRUBIN TOTAL: 0.9 mg/dL (ref 0.2–1.2)
Bilirubin, Direct: 0.2 mg/dL (ref 0.0–0.3)
Total Protein: 7.2 g/dL (ref 6.0–8.3)

## 2017-10-24 LAB — TSH: TSH: 2.39 u[IU]/mL (ref 0.35–4.50)

## 2017-10-24 MED ORDER — ESCITALOPRAM OXALATE 10 MG PO TABS: 10.0000 mg | ORAL_TABLET | Freq: Every day | ORAL | 2 refills | Status: AC

## 2017-10-24 MED ORDER — AZITHROMYCIN 250 MG PO TABS: ORAL_TABLET | ORAL | 0 refills | Status: AC

## 2017-10-24 MED FILL — AZITHROMYCIN 250 MG TABLET: 250 | 5 days supply | Qty: 6 | Fill #0

## 2017-10-24 MED FILL — ESCITALOPRAM 10 MG TABLET: 10 | 30 days supply | Qty: 30 | Fill #0

## 2017-10-24 NOTE — Progress Notes (Signed)
   Subjective:    Patient ID: Gary Leon, male    DOB: August 11, 1996, 21 y.o.   MRN: [id number]  HPI Here for several reasons. First he has been struggling with anxiety for a few months. He feels nervous, shaky, and he worries about everything. He has had a few "panic attacks" where he feels chest pain and SOB, and he has to walk out of the room wherever he is. He has trouble sleeping at night. Appetite is okay. No depression symptoms. He also mentions a hx of ADHD. He took medications as a teenager, but none for years now. He thinks this is still a problem because he has trouble focusing on his job. Also he has had 3 weeks of stuffy head, PND, ST, and a dry cough. He saw an urgent care clinic and was given Amoxicillin, but this did not help.   Review of Systems  Constitutional: Negative.   HENT: Positive for congestion, postnasal drip, sinus pressure and sore throat. Negative for sinus pain.   Eyes: Negative.   Respiratory: Positive for cough.   Cardiovascular: Negative.   Psychiatric/Behavioral: Positive for decreased concentration and sleep disturbance. Negative for agitation, confusion, dysphoric mood and hallucinations. The patient is nervous/anxious.        Objective:   Physical Exam  Constitutional: He is oriented to person, place, and time. He appears well-developed and well-nourished. No distress.  HENT:  Right Ear: External ear normal.  Left Ear: External ear normal.  Nose: Nose normal.  Mouth/Throat: Oropharynx is clear and moist.  Eyes: Conjunctivae are normal.  Neck: No thyromegaly present.  Cardiovascular: Normal rate, regular rhythm, normal heart sounds and intact distal pulses.  Pulmonary/Chest: Effort normal and breath sounds normal. No respiratory distress. He has no wheezes. He has no rales.  Lymphadenopathy:    He has no cervical adenopathy.  Neurological: He is alert and oriented to person, place, and time.  Psychiatric: He has a normal mood and affect. His  behavior is normal. Thought content normal.          Assessment & Plan:  He has a sinusitis, so he will take a Zpack. He has a lot of anxiety, so he will try Lexapro 10 mg daily. He has known ADHD but I think most of his problems now related to the anxiety. He may need medications for the ADHD at some point, but we will focus on treating the anxiety for now. Recheck in 3 weeks.  Gershon CraneStephen Fry, MD

## 2017-10-27 DIAGNOSIS — F900 Attention-deficit hyperactivity disorder, predominantly inattentive type: Secondary | ICD-10-CM | POA: Insufficient documentation

## 2017-10-27 DIAGNOSIS — F411 Generalized anxiety disorder: Secondary | ICD-10-CM | POA: Insufficient documentation

## 2018-01-07 ENCOUNTER — Encounter: Payer: Self-pay | Admitting: Family Medicine

## 2018-01-07 NOTE — Telephone Encounter (Signed)
If he wants to treat the ADHD, I suggest he try Strattera. This is a daily non-stimulant medication. It does not affect anxiety and it does not cause your heart to race. If he wishes we can start him on this.

## 2018-01-08 NOTE — Telephone Encounter (Signed)
I already answered this, please see my response

## 2018-01-14 NOTE — Telephone Encounter (Signed)
Okay. Call in Strattera 60 mg to take daily, #30 with 3 rf. Have him follow up with us in one month. We can increase the dose if needed

## 2018-01-15 ENCOUNTER — Telehealth: Payer: Self-pay

## 2018-01-15 MED ORDER — ATOMOXETINE HCL 60 MG PO CAPS: 60.0000 mg | ORAL_CAPSULE | Freq: Every day | ORAL | 3 refills | Status: DC

## 2018-01-15 NOTE — Telephone Encounter (Signed)
Okay. Call in Strattera 60 mg to take daily, #30 with 3 rf. Have him follow up with us in one month. We can increase the dose if needed  

## 2018-01-16 ENCOUNTER — Other Ambulatory Visit: Payer: Self-pay

## 2018-01-16 MED ORDER — ATOMOXETINE HCL 60 MG PO CAPS: 60.0000 mg | ORAL_CAPSULE | Freq: Every day | ORAL | 3 refills | Status: AC

## 2018-01-16 NOTE — Telephone Encounter (Signed)
Called [facility] and cancel the Rx on file for Strattera. Waiting on pt to respond back to me on Mychart so [facility] just need to know exactly what one.

## 2018-01-22 NOTE — Telephone Encounter (Signed)
As far as I can tell, it is safe to use CBD oil

## 2018-01-27 ENCOUNTER — Telehealth: Payer: Self-pay | Admitting: Family Medicine

## 2018-01-27 NOTE — Telephone Encounter (Signed)
Copied from CRM [id number]. Topic: Quick Communication - Rx Refill/Question >> Jan 27, 2018  4:59 PM Zada GirtLander, Lumin L wrote: Medication: Strattera 60 mg (patient would like to start out with a lower dosage) Has the patient contacted their pharmacy? Yes.   (Agent: If no, request that the patient contact the pharmacy for the refill.) Preferred Pharmacy (with phone number or street name): [id number] - [address] - [address] AT [facility] [address] [place] [id number] Phone: [phone] Fax: [phone] Agent: Please be advised that RX refills may take up to 3 business days. We ask that you follow-up with your pharmacy.

## 2018-01-27 NOTE — Telephone Encounter (Deleted)
Copied from CRM #[id number]. Topic: Quick Communication - Rx Refill/Question °>> Jan 27, 2018  4:59 PM Lander, Lumin L wrote: °Medication: Strattera 60 mg (patient would like to start out with a lower dosage) °Has the patient contacted their pharmacy? Yes.   °(Agent: If no, request that the patient contact the pharmacy for the refill.) °Preferred Pharmacy (with phone number or street name): [id number] - [place], [place] - [address] AT [address] ([address] °[address] [place] [address] °Phone: [phone] Fax: [phone] °Agent: Please be advised that RX refills may take up to 3 business days. We ask that you follow-up with your pharmacy. °

## 2018-01-28 NOTE — Telephone Encounter (Signed)
Call to patient - he is concerned about the side effects he had as a child ( mood swings) - he has talked to the pharmacist and would like to start out on very low dosing and build up. He is interested in 14 or 16 mg starting dose. Told patient we would relay his concern and let him know what Dr Clent RidgesFry suggest.

## 2018-01-28 NOTE — Telephone Encounter (Signed)
Sent to PCP to advise 

## 2018-01-29 NOTE — Telephone Encounter (Signed)
Change the dose of Strattera to 18 mg daily. Call in #30 with 2 rf

## 2018-01-30 MED ORDER — ATOMOXETINE HCL 18 MG PO CAPS: 18.0000 mg | ORAL_CAPSULE | Freq: Every day | ORAL | 2 refills | Status: DC

## 2018-01-30 NOTE — Telephone Encounter (Signed)
Rx has been called into pt's pharmacy. Called and spoke with pt. Pt advised and voiced understanding.

## 2018-02-17 ENCOUNTER — Other Ambulatory Visit: Payer: Self-pay | Admitting: Family Medicine

## 2018-02-17 NOTE — Telephone Encounter (Signed)
Staterra LOV: 12/21/8 PCP: Dr Gershon CraneStephen Fry [address] [address] [place]

## 2018-02-17 NOTE — Telephone Encounter (Signed)
Copied from CRM #[id number]. Topic: General - Other >> Feb 17, 2018 [id number] [id number] PM Gary Leon, Gary Leon, RMA wrote: Reason for CRM: Medication refill request for atomoxetine (STRATTERA) 18 MG [facility] [place]

## 2018-02-18 NOTE — Telephone Encounter (Signed)
Sent to PCP to advise 

## 2018-02-19 MED ORDER — ATOMOXETINE HCL 18 MG PO CAPS: 18.0000 mg | ORAL_CAPSULE | Freq: Every day | ORAL | 1 refills | Status: DC

## 2018-02-24 ENCOUNTER — Other Ambulatory Visit: Payer: Self-pay

## 2018-02-24 MED ORDER — ATOMOXETINE HCL 18 MG PO CAPS: 18.0000 mg | ORAL_CAPSULE | Freq: Every day | ORAL | 1 refills | Status: DC

## 2018-02-24 NOTE — Telephone Encounter (Signed)
Called the pharmacy at [facility] and that the 18 MG Strattera removed. Called in the Rx again into pt's requested pharmacy.

## 2018-02-24 NOTE — Telephone Encounter (Signed)
Patient said that atomoxetine (STRATTERA) 18 MG capsule was not suppose to go to [facility], he said [place] & [facility]   [facility] [address] [place] [id number]

## 2018-02-24 NOTE — Telephone Encounter (Signed)
Called and spoke with pt. Pt advised and voiced understanding.  

## 2018-03-03 ENCOUNTER — Encounter: Payer: Self-pay | Admitting: Family Medicine

## 2018-03-09 NOTE — Telephone Encounter (Signed)
Yes there is plenty of room to increase the dose if needed. Call in Strattera 40 mg a day, #30 with 5 rf

## 2018-03-10 NOTE — Telephone Encounter (Signed)
Yes he can try taking 2 or even 3 of the ones he has at a time. Give it 2-3 weeks to see how it works

## 2018-04-01 ENCOUNTER — Encounter: Payer: Self-pay | Admitting: Family Medicine

## 2018-04-02 NOTE — Telephone Encounter (Signed)
Call in this to the new pharmacy, to take 3 tabs daily, #90 with 2 rf. Also ask him if he has moved to [place]?

## 2018-04-03 ENCOUNTER — Other Ambulatory Visit: Payer: Self-pay

## 2018-04-03 MED ORDER — ATOMOXETINE HCL 18 MG PO CAPS
18.0000 mg | ORAL_CAPSULE | Freq: Three times a day (TID) | ORAL | 2 refills | Status: DC
Start: 2018-04-03 — End: 2018-04-24

## 2018-04-08 NOTE — Telephone Encounter (Signed)
I see [place] to [place]. We gave him a 90 day supply of medication. He will need to find a PCP there in [place], because we will not be able to give him any further refills after the 90 days

## 2018-04-13 ENCOUNTER — Telehealth: Payer: Self-pay | Admitting: *Deleted

## 2018-04-13 NOTE — Telephone Encounter (Signed)
Prior auth for Atomoxetine 18 mg capsules sent to [email or web address].

## 2018-04-20 ENCOUNTER — Telehealth: Payer: Self-pay

## 2018-04-20 NOTE — Telephone Encounter (Signed)
Called and spoke with [employer] they needed clarification as to why pt is taking Strattera 18 MG TID insurance plan will only cover for BID. I advised them that it was the pt's proference was done for short term reasons to see if pt felt he could tolerate that dose well or not.

## 2018-04-20 NOTE — Telephone Encounter (Signed)
Copied from CRM [id number]. Topic: General - Other >> Apr 20, 2018  1:14 PM Tamela OddiHarris, Brenda J wrote: Reason for CRM: Sherald HessWesten from [employer] called requesting clarification on dosage for atomoxetine (STRATTERA) 18 MG capsule medication.  CB# [phone], Fax# [phone], Reference # [id number].

## 2018-04-23 ENCOUNTER — Telehealth: Payer: Self-pay

## 2018-04-23 NOTE — Telephone Encounter (Signed)
Fax from [employer]   Prior Authorization needed for ATOMOXETINE HCL 18 MG OR CAPS  Sent to [place] placed on desk.

## 2018-04-24 MED ORDER — ATOMOXETINE HCL 18 MG PO CAPS: 18.0000 mg | ORAL_CAPSULE | Freq: Three times a day (TID) | ORAL | 0 refills | Status: AC

## 2018-04-24 NOTE — Telephone Encounter (Signed)
Called pt and left a detailed VM that we did call in Rx into hs requested pharmacy.

## 2018-04-24 NOTE — Telephone Encounter (Signed)
[facility] [place] with no rf

## 2018-04-24 NOTE — Telephone Encounter (Signed)
The request has been approved. The authorization is effective for a maximum of 12 fills from 04/08/2018 to 04/08/2019, as long as the member is enrolled in their current health plan. The request was reviewed and approved by a licensed clinical pharmacist. This request is approved for 3 capsules per day. Please note that maintenance medications, including atomoxetine capsules, must be filled at a [facility] pharmacy. Please call [phone] for more information. A written notification letter will follow with additional details.

## 2018-04-24 NOTE — Telephone Encounter (Signed)
Called and spoke with pt. Pt advised his PA was approved. Pt stated that he actually need [facility] [address], [place], [place] [id number]. Pt stated that he moved again and he is completely out of his medication. Pt stated that he is [facility] but would like something sent in if possible.   Sent to PCP to advised.  Last refilled for the 18 MG 04/03/2018 disp 90 with 2 refills

## 2018-04-24 NOTE — Addendum Note (Signed)
Addended by: Gracelyn NurseBLACKWELL, SHELBY P on: 04/24/2018 04:02 PM   Modules accepted: Orders

## 2018-04-24 NOTE — Telephone Encounter (Signed)
Duplicate message. See 04/13/18 phone note. PA has already been initiated and approved.

## 2018-04-27 DIAGNOSIS — Z1389 Encounter for screening for other disorder: Secondary | ICD-10-CM | POA: Diagnosis not present

## 2018-04-27 DIAGNOSIS — F900 Attention-deficit hyperactivity disorder, predominantly inattentive type: Secondary | ICD-10-CM | POA: Diagnosis not present

## 2018-04-27 DIAGNOSIS — I10 Essential (primary) hypertension: Secondary | ICD-10-CM | POA: Diagnosis not present

## 2018-06-01 DIAGNOSIS — F900 Attention-deficit hyperactivity disorder, predominantly inattentive type: Secondary | ICD-10-CM | POA: Diagnosis not present

## 2018-07-10 DIAGNOSIS — H52221 Regular astigmatism, right eye: Secondary | ICD-10-CM | POA: Diagnosis not present

## 2018-07-10 DIAGNOSIS — H5213 Myopia, bilateral: Secondary | ICD-10-CM | POA: Diagnosis not present

## 2018-12-29 DIAGNOSIS — J0100 Acute maxillary sinusitis, unspecified: Secondary | ICD-10-CM | POA: Diagnosis not present

## 2019-11-10 DIAGNOSIS — R52 Pain, unspecified: Secondary | ICD-10-CM | POA: Diagnosis not present

## 2019-11-10 DIAGNOSIS — J069 Acute upper respiratory infection, unspecified: Secondary | ICD-10-CM | POA: Diagnosis not present
